# Patient Record
Sex: Male | Born: 1950 | Race: White | Hispanic: No | Marital: Married
Health system: Midwestern US, Academic
[De-identification: ages and names within clinical notes are randomized; demographics above are authoritative.]

---

## 2018-07-28 ENCOUNTER — Encounter: Admit: 2018-07-28 | Discharge: 2018-07-29 | Payer: Commercial Managed Care - HMO

## 2018-07-28 ENCOUNTER — Encounter: Admit: 2018-07-28 | Discharge: 2018-07-28 | Payer: Commercial Managed Care - HMO

## 2018-07-28 DIAGNOSIS — R69 Illness, unspecified: Principal | ICD-10-CM

## 2018-08-24 ENCOUNTER — Encounter: Admit: 2018-08-24 | Discharge: 2018-08-25 | Payer: Commercial Managed Care - HMO

## 2018-08-24 DIAGNOSIS — R69 Illness, unspecified: Principal | ICD-10-CM

## 2018-10-31 ENCOUNTER — Encounter: Admit: 2018-10-31 | Discharge: 2018-11-01 | Payer: Commercial Managed Care - HMO

## 2018-10-31 ENCOUNTER — Encounter: Admit: 2018-10-31 | Discharge: 2018-10-31 | Payer: Commercial Managed Care - HMO

## 2018-10-31 DIAGNOSIS — R69 Illness, unspecified: Principal | ICD-10-CM

## 2018-11-13 ENCOUNTER — Encounter: Admit: 2018-11-13 | Discharge: 2018-11-13

## 2018-11-13 NOTE — Telephone Encounter
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    Physician Info:   ??? Referring Physician:  Dr. Lyndee Hensen  ??? Contact Name & Number:  [phone]  ??? Surgeon:  Dr. Loralie Champagne  ??? Medical Oncologist:  Dr. Emmie Niemann [place]  ??? Radiation Oncologist:  Dr. Joycelyn Man - [place]  ??? PCP:  Dr. Erskine Emery - [place]  ??? Other:   Dr. Cathlean Sauer [place] - [place]    Location of Films:  Requested from [facility]    Location of Pathology:  [facility] and Patient notified outside pathology slides will be obtained for review by [place] pathologist and a facility and professional fee will be billed to their insurance.    History of Present Illness:    In January 2020 patient presented to his PCP with 1 week history of blood in stools. He'd never had a colonoscopy.    07/19/2018 - COLONOSCOPY w/BX - see paper packet    07/19/2018 - PATHOLOGY - see paper packet  Rectal mass - tubular adenoma with at least high-grade dysplasia and foci suspicious for adenocarcinoma.  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    09/05/2018 - 10/12/2018 - infusional 5FU with radiation to rectum - 4500 cGy in 34 days and 540cGy in 2 days    10/31/2018 CT Chest/Abd/Pelvis - see paper packet  Interval development of a 1.5cm hypervascular lesion within the posterior segment of the right hepatic lobe - possible flash filling hemangioma. Rectal mass not well seen.    11/03/2018 - FU with Dr. Donnajean Lopes - see paper packet  Has completed neoadjuvant therapy - ready for surgery. Will need adjuvant chemo post resection. Plan MRI of liver to evaluate new lesion on recent CT AP    11/06/2018 - office visit with Dr. Boris Lown to discuss surgery - recommends colostomy - patient wouldn't give consent for surgery - seeking 2nd opinion    11/10/2018 CBC, CMP - see paper packet    11/14/2018 MRI Liver - results pending - being done at outside facility    Prior Treatment (XRT, Surgery, Chemotherapy):  5FU (see paper packet for doses) and radiation therapy    Comments:  Spoke with patient's wife and informed her of date, time and location of appointment. Wife verbalized understanding. Patient guide sent to home e-mail address.      COVID-19 guidelines reviewed with patient, including: visitor and universal masking policies,  and a temperature check at the facility entrance upon arrival. Wife voiced understanding - they have access to masks.     NEEDS Assessment:    Genetic Counseling:  Genetic Assessment: Other (Comment)(Patient is adopted)        Nutrition:  Current Weight (in pounds): 175  Recent Weight Loss Without Trying?: Yes, 2-13 lb.  Eating Poorly Due to Decreased Appetite?: No  Score: Malnutrition Screening Tool (MST): 1  Additional Nutrition Assessment: No concerns identified  Nutrition Intervention: Provided information about available services    Social & Financial:  Social and Financial Assessment: Reports adequate support system;No needs identified  Tobacco assessment last 30 days: Patient has used tobacco products within the last 30 days(1 pack/day)  Social and Financial Intervention: Notified provider of current tobacco use status;Provided information about available services    Spiritual & Emotional:  Spiritual and Emotional Assessment: Reports adequate support system;No needs identified  Spiritual and Emotional Intervention: Emotional Support provided;Provided information about available services    Physical:  Fall Risk: None identified  Pain Score: Zero  Fatigue Scale: 0-None     Communication:  Communication Barrier: No     Onc Fertility:   Onc Fertility Assessment: Not assessed at this time    Patient Education  Education provided to: patient's caregiver  Preferred method: oral instruction;written instruction  Are learners ready to learn?: Yes  Are there barriers to learning?: No     Phase of patient's treatment: During Treatment    Topics Discussed  Topics discussed: orientation to Cancer Center(COVID restrictions)     Ph # given to patient for follow up: Yes    Education Details  Educated by: telephone  Ed  time: 10 min  Learner's response: The patient expressed understanding of what was explained to them, participated and agreed with the present plan.;The patient denies need.(Patient's wife responded)

## 2018-11-14 ENCOUNTER — Encounter: Admit: 2018-11-14 | Discharge: 2018-11-15

## 2018-11-14 ENCOUNTER — Encounter: Admit: 2018-11-14 | Discharge: 2018-11-14

## 2018-11-14 DIAGNOSIS — R69 Illness, unspecified: Secondary | ICD-10-CM

## 2018-11-14 NOTE — Progress Notes
H&E slides accession number [id number] request on 11/14/18 from [facility].  UPS shipping label number [id number].

## 2018-11-15 ENCOUNTER — Encounter: Admit: 2018-11-15 | Discharge: 2018-11-15

## 2018-11-15 DIAGNOSIS — R000 Tachycardia, unspecified: Secondary | ICD-10-CM

## 2018-11-15 DIAGNOSIS — C20 Malignant neoplasm of rectum: Secondary | ICD-10-CM

## 2018-11-15 DIAGNOSIS — R69 Illness, unspecified: Secondary | ICD-10-CM

## 2018-11-15 DIAGNOSIS — I10 Essential (primary) hypertension: Secondary | ICD-10-CM

## 2018-11-15 NOTE — Progress Notes
Name: Devlyn Placek          MRN: [id number]      DOB: Oct 21, 1950      AGE: 68 y.o.   DATE OF SERVICE: 11/15/2018    Subjective:             Reason for Visit:  New CA Pt      Demareo Davison is a 68 y.o. male.     Cancer Staging  No matching staging information was found for the patient.    History of Present Illness     Mr. Bradle is a 68 year old male with history of newly diagnosed rectal cancer.  He started experiencing bleeding in his stool in January of this year.  This was persistent visit his primary care doctor.  He was subsequently worked up with a colonoscopy.  This colonoscopy showed a large rectal mass at 11 to 14 cm from the anal verge.  Biopsy of this mass showed moderately differentiated rectal adenocarcinoma.  Subsequent endoscopic ultrasound was performed which staged a T3N0 mass.  Staging CT chest abdomen pelvis was performed on July 28, 2018.  It showed a high rectal wall thickening compatible with the patient's colonic mass.  There was no other CT evidence of metastatic disease to the thorax or abdomen.  He then underwent neoadjuvant chemotherapy which she completed on May 7 of 2020.  Repeat staging CT was performed on Oct 31, 2018.  It showed the patient's rectal mass was not well seen, there was a interval development of a 1.5 cm hypervascular lesion on the right hepatic lobe.  MRI on November 14, 2018 showed no suspicious hepatic masses.    He presents to clinic today for surgical discussion.  He states that he no longer has blood in his stools and he has been feeling well.  He denies any rectal pain or fullness.  The last about 20 pounds while undergoing chemotherapy however he has been gaining weight recently.  He feels he has adequate energy to complete his daily activities.  He does appear reluctant to have an ostomy.  His father did have a history of colon cancer and had an ostomy for about 3 years.  He does currently smoke a pack a day.  He states he has been trying to quit however has been unsuccessful due to the stress of the situation.  His only past abdominal surgery was an open appendectomy when he was in high school.       Review of Systems   Constitutional: Positive for unexpected weight change. Negative for activity change and appetite change.   HENT: Negative for congestion.    Respiratory: Negative for chest tightness and shortness of breath.    Cardiovascular: Negative for chest pain.   Gastrointestinal: Positive for blood in stool. Negative for abdominal distention, abdominal pain and nausea.   Genitourinary: Negative for difficulty urinating.   Musculoskeletal: Negative for arthralgias.   Skin: Negative for color change.   Neurological: Negative for dizziness.   Hematological: Negative for adenopathy.   Psychiatric/Behavioral: Negative for agitation.   The remainder of the complete 12-point comprehensive ROS is otherwise entirely negative.    Medical History:   Diagnosis Date   ??? Essential hypertension 05/19/2016   ??? Rectal cancer (HCC)    ??? Tachycardia 05/19/2016     Surgical History:   Procedure Laterality Date   ??? APPENDECTOMY     ??? COLONOSCOPY     ??? PORTACATH PLACEMENT  History reviewed. No pertinent family history.  Social History     Socioeconomic History   ??? Marital status: Married     Spouse name: Not on file   ??? Number of children: Not on file   ??? Years of education: Not on file   ??? Highest education level: Not on file   Occupational History   ??? Not on file   Tobacco Use   ??? Smoking status: Current Every Day Smoker     Packs/day: 1.00     Years: 50.00     Pack years: 50.00     Types: Cigarettes   ??? Smokeless tobacco: Never Used   Substance and Sexual Activity   ??? Alcohol use: Not Currently   ??? Drug use: No   ??? Sexual activity: Not on file   Other Topics Concern   ??? Not on file   Social History Narrative   ??? Not on file       Objective:         ??? cyanocobalamin (vitamin B-12) 2,500 mcg tab Take  by mouth twice weekly. ??? propranolol LA (INDERAL LA) 120 mg capsule Take 120 mg by mouth daily.     Vitals:    11/15/18 0816   BP: (!) 143/72   BP Source: Arm, Right Upper   Patient Position: Sitting   Pulse: 68   Resp: 16   Temp: 36.4 ???C (97.5 ???F)   SpO2: 98%   Weight: 86.3 kg (190 lb 3.2 oz)   Height: 182.9 cm (72)   PainSc: Zero     Body mass index is 25.8 kg/m???.     Pain Score: Zero       Fatigue Scale: 3    Pain Addressed:  N/A    Patient Evaluated for a Clinical Trial: No treatment clinical trial available for this patient.     [place] Cooperative Oncology Group performance status is 0, Fully active, able to carry on all pre-disease performance without restriction.Marland Kitchen     Physical Exam  Constitutional:       General: He is not in acute distress.     Appearance: Normal appearance. He is not ill-appearing, toxic-appearing or diaphoretic.   HENT:      Head: Normocephalic and atraumatic.      Right Ear: Tympanic membrane normal.      Mouth/Throat:      Mouth: Mucous membranes are moist.      Pharynx: Oropharynx is clear. No oropharyngeal exudate or posterior oropharyngeal erythema.   Eyes:      General: No scleral icterus.        Right eye: No discharge.         Left eye: No discharge.      Extraocular Movements: Extraocular movements intact.      Conjunctiva/sclera: Conjunctivae normal.      Pupils: Pupils are equal, round, and reactive to light.   Neck:      Musculoskeletal: Normal range of motion and neck supple. No neck rigidity or muscular tenderness.   Cardiovascular:      Rate and Rhythm: Normal rate and regular rhythm.      Pulses: Normal pulses.   Pulmonary:      Effort: Pulmonary effort is normal. No respiratory distress.      Breath sounds: No stridor. No wheezing.   Chest:      Chest wall: No tenderness.   Abdominal:      General: Abdomen is flat. Bowel sounds are normal. There is  no distension.      Palpations: Abdomen is soft. There is no mass.      Tenderness: There is no abdominal tenderness. There is no right CVA tenderness, left CVA tenderness, guarding or rebound.      Hernia: No hernia is present.   Musculoskeletal: Normal range of motion.         General: No tenderness, deformity or signs of injury.   Lymphadenopathy:      Cervical: No cervical adenopathy.   Skin:     General: Skin is warm.      Capillary Refill: Capillary refill takes less than 2 seconds.      Coloration: Skin is not jaundiced.   Neurological:      General: No focal deficit present.      Mental Status: He is alert and oriented to person, place, and time. Mental status is at baseline.   Psychiatric:         Mood and Affect: Mood normal.         Behavior: Behavior normal.         Thought Content: Thought content normal.         Judgment: Judgment normal.               Assessment and Plan:    68 year old male with recent diagnosis of rectal cancer with stage T3N0 on EUS who has completed neoadjuvant chemoradiation therapy, presenting today for surgical discussion.    -Will plan for a robotic LAR with diverting loop ileostomy.  -Risks and benefits of the procedure were fully discussed with the patient including the risk of anastomotic lea.  He demonstrates good understanding and wishes to proceed with the operation.  -Will consent today for surgery  counseled him on smoking cessation and the perioperative benefits of it.  -Patient seen and discussed with Dr. Rainey Pines who directed plan of care.    Rowland Lathe, DO  General Surgery PGY-4    ATTESTATION    I personally performed the key portions of the E/M visit, discussed case with resident and concur with resident documentation of history, physical exam, assessment, and treatment plan unless otherwise noted.     Mr. Lograsso has done well with neoadjuvant chemoradiation therapy. A questionable liver lesion on CT proved to be negative on MRI. Recommend to proceed with robotic low anterior resection. The rationale for this approach was discussed with the patient, who understood and seemed comfortable with the plan and recommendations.  I answered all questions to the patient's satisfaction. He wishes to move forward with the operation. The risks and benefits of surgery were discussed in detail with the patient.  I answered all questions to the patient's satisfaction.  A consent form was signed.    Staff name:  Dia Crawford, MD Date:  11/20/2018

## 2018-11-16 ENCOUNTER — Encounter: Admit: 2018-11-16 | Discharge: 2018-11-16

## 2018-11-16 DIAGNOSIS — Z1159 Encounter for screening for other viral diseases: Secondary | ICD-10-CM

## 2018-11-16 DIAGNOSIS — C20 Malignant neoplasm of rectum: Secondary | ICD-10-CM

## 2018-11-16 MED ORDER — NEOMYCIN 500 MG PO TAB
ORAL_TABLET | 0 refills | Status: AC
Start: 2018-11-16 — End: ?

## 2018-11-16 MED ORDER — CEFOXITIN 2G/100ML NS IVPB (MB+) (OR ONLY)
2 g | Freq: Once | INTRAVENOUS | 0 refills | Status: CN
Start: 2018-11-16 — End: ?

## 2018-11-16 MED ORDER — GABAPENTIN 300 MG PO CAP
600 mg | Freq: Once | ORAL | 0 refills | Status: CN
Start: 2018-11-16 — End: ?

## 2018-11-16 MED ORDER — ACETAMINOPHEN 500 MG PO TAB
1000 mg | Freq: Once | ORAL | 0 refills | Status: CN
Start: 2018-11-16 — End: ?

## 2018-11-16 MED ORDER — CELECOXIB 100 MG PO CAP
200 mg | Freq: Once | ORAL | 0 refills | Status: CN
Start: 2018-11-16 — End: ?

## 2018-11-16 MED ORDER — SODIUM CHLORIDE 0.9 % IV SOLP
250 mL | INTRAVENOUS | 0 refills | Status: CN
Start: 2018-11-16 — End: ?

## 2018-11-16 MED ORDER — METRONIDAZOLE 500 MG PO TAB
ORAL_TABLET | 0 refills | Status: AC
Start: 2018-11-16 — End: ?

## 2018-11-17 ENCOUNTER — Encounter: Admit: 2018-11-17 | Discharge: 2018-11-17

## 2018-11-17 DIAGNOSIS — C20 Malignant neoplasm of rectum: Secondary | ICD-10-CM

## 2018-11-20 ENCOUNTER — Encounter: Admit: 2018-11-20 | Discharge: 2018-11-20

## 2018-11-20 DIAGNOSIS — C20 Malignant neoplasm of rectum: Secondary | ICD-10-CM

## 2018-11-20 DIAGNOSIS — R000 Tachycardia, unspecified: Secondary | ICD-10-CM

## 2018-11-20 DIAGNOSIS — I10 Essential (primary) hypertension: Secondary | ICD-10-CM

## 2023-07-29 IMAGING — CT NECK W(Adult)
2 series · 4 of 6 positions shown, 6 images · non-contrast
Comparison: none

[Series 2: neck ax 3.00 br40 s3 · axial · 0.38mm/px · z∈[-678,-555]mm · 3 of 3 slices shown, 4 images]
[im 1/3  soft-tissue]
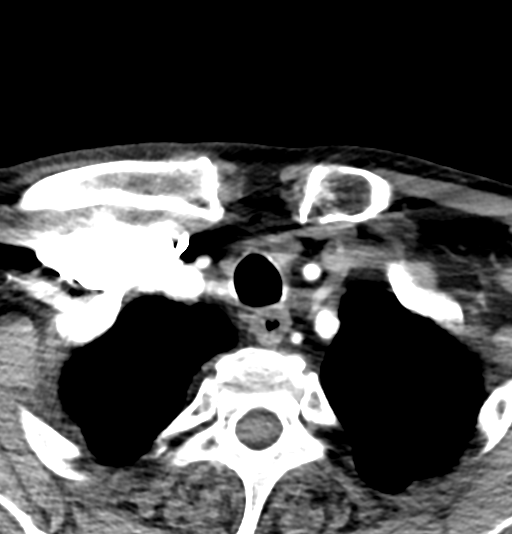
[im 1/3  bone]
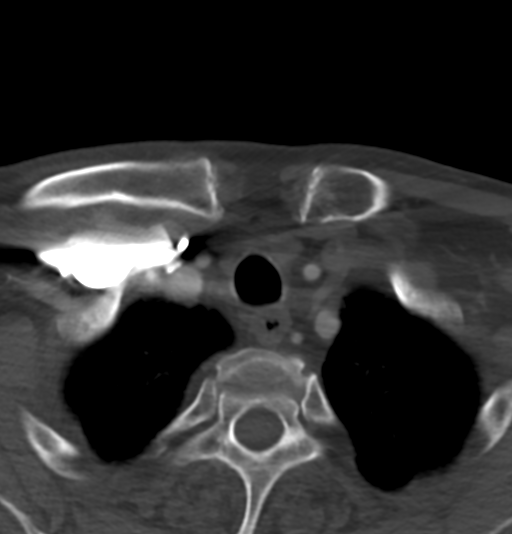
[im 2/3  bone]
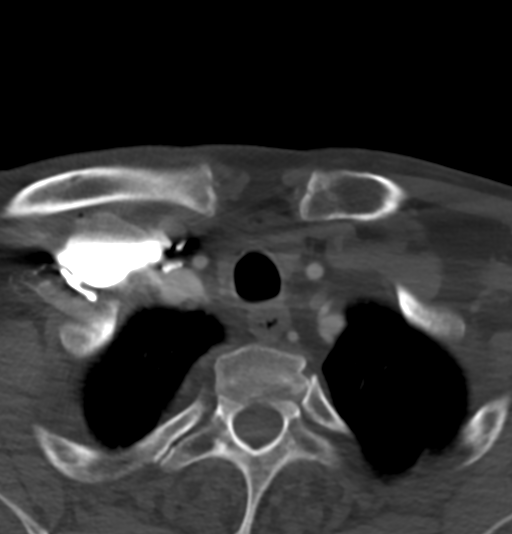
[im 3/3  bone]
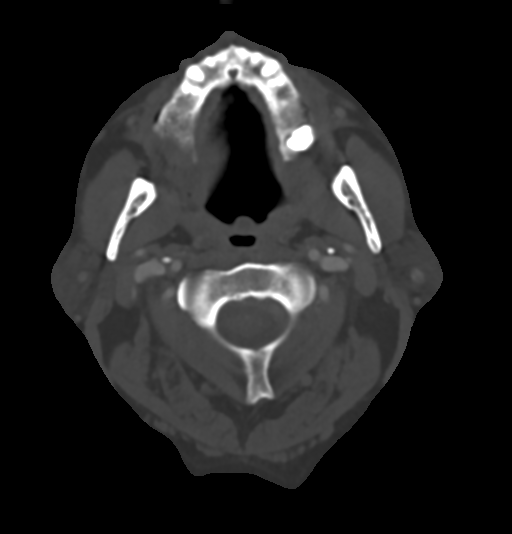

[Series 6: neck sag 3.00 br40 s3 · sagittal · 0.40mm/px · 1 of 3 slices shown, 2 images]
[im 2/3  soft-tissue]
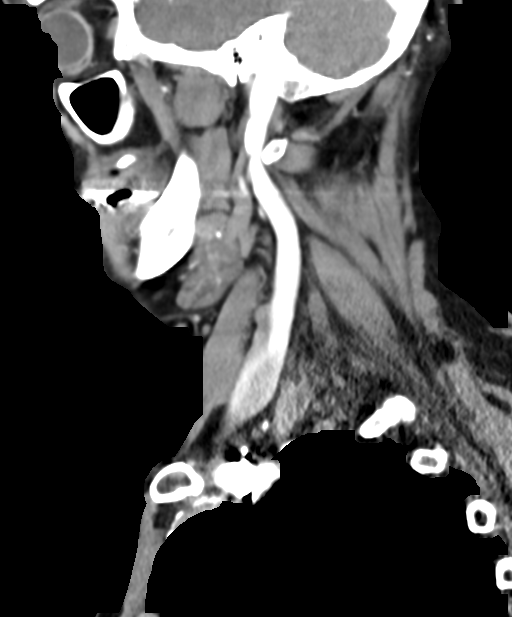
[im 2/3  bone]
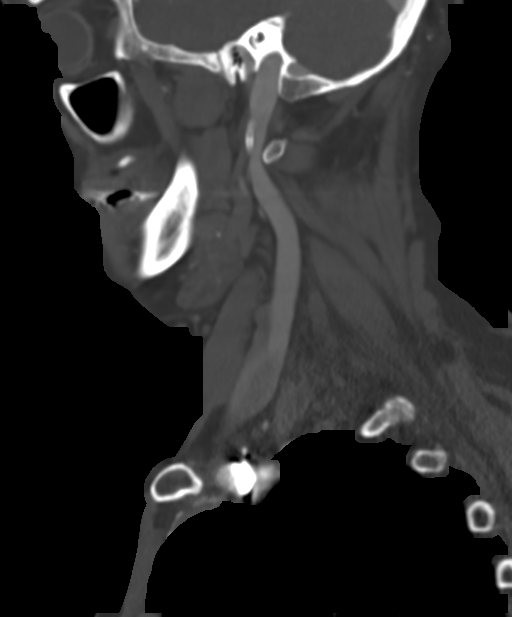

[4 of 6 positions shown; findings below may reference images not displayed]

DIAGNOSTIC STUDIES

EXAM

COMPUTED TOMOGRAPHY, SOFT TISSUE NECK, WITH CONTRAST; CPT 96474

INDICATION

long term smoker/throat pain
Laryngitis and throat pain x several months. Current smoker hx. colorectal cancer. Creat .91 GFR
86.9 OMNI 100ml

TECHNIQUE

Contiguous axial images were obtained from the mid skull to the lung apices with intravenous
contrast. Coronal and sagittal images were obtained.

All CT scans at this facility use dose modulation, iterative reconstruction, and/or weight based
dosing when appropriate to reduce radiation dose to as low as reasonably achievable.

0 CT and 0 nuclear scans in the last year.

COMPARISONS

No priors available for comparison.

FINDINGS

No abnormal intracranial enhancement. The orbits appear grossly unremarkable. The parotid and
submandibular glands appear grossly unremarkable. There is mild soft tissue prominence of the uvula.
No retropharyngeal collection. There is a 1.4 x 2.3 x 2.7 cm soft tissue mass involving the right
vocal cord. The thyroid gland is normal in size. No pathologically enlarged lymph nodes.
Centrilobular emphysematous changes within the upper thorax. There is a 2.3 cm polyp within the left
maxillary sinus. Mild degenerative changes within the cervical spine.

IMPRESSION

1. There is a 1.4 x 2.3 x 2.7 cm soft tissue mass involving the right vocal cord.

Findings are suspicious for a neoplasm. Differential diagnosis includes an infectious etiology.

Follow-up with an ENT consultation and biopsy is recommended.

Tech Notes:

Laryngitis and throat pain x several months. Current smoker hx. colorectal cancer. Creat .91 GFR
86.9 OMNI 100ml
